# Patient Record
Sex: Female | Born: 1975 | Race: Black or African American | Hispanic: No | Marital: Married | Smoking: Never smoker
Health system: Southern US, Community
[De-identification: ages and names within clinical notes are randomized; demographics above are authoritative.]

## PROBLEM LIST (undated history)

## (undated) DIAGNOSIS — I10 Essential (primary) hypertension: Secondary | ICD-10-CM

## (undated) DIAGNOSIS — E669 Obesity, unspecified: Secondary | ICD-10-CM

## (undated) HISTORY — PX: ABDOMINAL HYSTERECTOMY: SHX81

---

## 2017-06-28 ENCOUNTER — Emergency Department (HOSPITAL_COMMUNITY)
Admission: EM | Admit: 2017-06-28 | Discharge: 2017-06-28 | Disposition: A | Discharge status: Home / Self Care | Payer: Self-pay | Attending: Emergency Medicine | Admitting: Emergency Medicine

## 2017-06-28 ENCOUNTER — Encounter (HOSPITAL_COMMUNITY): Payer: Self-pay | Admitting: Emergency Medicine

## 2017-06-28 ENCOUNTER — Emergency Department (HOSPITAL_COMMUNITY): Payer: Self-pay

## 2017-06-28 DIAGNOSIS — R2241 Localized swelling, mass and lump, right lower limb: Secondary | ICD-10-CM | POA: Insufficient documentation

## 2017-06-28 DIAGNOSIS — R229 Localized swelling, mass and lump, unspecified: Secondary | ICD-10-CM | POA: Insufficient documentation

## 2017-06-28 DIAGNOSIS — I10 Essential (primary) hypertension: Secondary | ICD-10-CM | POA: Insufficient documentation

## 2017-06-28 DIAGNOSIS — IMO0002 Reserved for concepts with insufficient information to code with codable children: Secondary | ICD-10-CM

## 2017-06-28 HISTORY — DX: Essential (primary) hypertension: I10

## 2017-06-28 MED ORDER — METHYLPREDNISOLONE 4 MG PO TBPK: ORAL_TABLET | ORAL | 0 refills | Status: DC

## 2017-06-28 MED ORDER — TRAMADOL-ACETAMINOPHEN 37.5-325 MG PO TABS: ORAL_TABLET | ORAL | 0 refills | Status: DC

## 2017-06-28 NOTE — ED Provider Notes (Signed)
[facility] DEPT Provider Note   CSN: [id number] Arrival date & time: 06/28/17  1713     History   Chief Complaint Chief Complaint  Patient presents with  . Leg Pain    HPI Joan Powers is a 41 y.o. female.  HPI Patient has had a tender nodule in her right upper thigh for about 2 months. Nothing makes it better or worse. He denies her as any trauma or any inciting event that she can identify. She reports that she sleeps on that side and puts pressure on it is painful. No other associated symptoms. No weight loss no fever no chills no nausea no vomiting. No other areas of painful nodules. Past Medical History:  Diagnosis Date  . Hypertension     There are no active problems to display for this patient.   History reviewed. No pertinent surgical history.  OB History    No data available       Home Medications    Prior to Admission medications   Not on File    Family History No family history on file.  Social History Social History  Substance Use Topics  . Smoking status: Never Smoker  . Smokeless tobacco: Never Used  . Alcohol use No     Allergies   Patient has no allergy information on record.   Review of Systems Review of Systems 10 Systems reviewed and are negative for acute change except as noted in the HPI.   Physical Exam Updated Vital Signs BP (!) 167/107 (BP Location: Right Wrist)   Pulse 88   Temp 98.6 F (37 C) (Oral)   Resp 17   Ht 5\' 2"  (1.575 m)   SpO2 100%   Physical Exam  Constitutional: She is oriented to person, place, and time.  Patient is alert and nontoxic. No acute distress.  HENT:  Head: Normocephalic and atraumatic.  Eyes: EOM are normal.  Pulmonary/Chest: Effort normal.  Musculoskeletal:  Patient indicates an area in her mid lateral thigh that is area tenderness. She isolates the area where she finds the nodule. Patient does have  obesity of the legs with cellulite. The skin is normal. There is no erythema,  rash or overlying skin changes. There does appear to be a subtle, round or oval-shaped nodule within the soft tissues of the adipose tissue. This feels to be about 2 cm. It does feel slightly firmer than a lipoma. No surrounding varicose veins. No knee effusion. Lower leg nontender. Skin condition of legs is normal.  Neurological: She is alert and oriented to person, place, and time. No cranial nerve deficit. She exhibits normal muscle tone. Coordination normal.  Skin: Skin is warm and dry.  Psychiatric: She has a normal mood and affect.     ED Treatments / Results  Labs (all labs ordered are listed, but only abnormal results are displayed) Labs Reviewed - No data to display  EKG  EKG Interpretation None       Radiology No results found.  Procedures Procedures (including critical care time)  Medications Ordered in ED Medications - No data to display   Initial Impression / Assessment and Plan / ED Course  I have reviewed the triage vital signs and the nursing notes.  Pertinent labs & imaging results that were available during my care of the patient were reviewed by me and considered in my medical decision making (see chart for details).     Final Clinical Impressions(s) / ED Diagnoses   Final diagnoses:  Lump  Physical exam findings are really very subtle. I cannot appreciate a well-defined mass however it does feel that there is some difference in the tissue consistency in the area the patient points out. She does have a significant amount of adipose tissue and I have fairly high suspicion for a lipoma. Patient however reports that the areas very painful. It has been going on for 2 months. There is no indication there is abscess associated. I feel absolutely no fluctuance and all of the overlying skin tissue is normal. Ultrasound does not identify soft tissue anomaly. At this time, for possible muscular pain, patient will be given a short course of Solu-Medrol and tramadol.  She is however counseled on the importance of follow-up to make sure there is no indolent and serious process such as a sarcoma developing. She understands the follow-up plan is to see either general surgery or a family provider for further diagnostic evaluation if symptoms are persisting after treatment. Her review of systems is otherwise completely negative.  New Prescriptions New Prescriptions   No medications on file     Charlesetta Shanks, MD 06/28/17 2317

## 2017-06-28 NOTE — ED Notes (Signed)
Pt stable, ambulatory, and verbalizes understanding of d/c instructions.  

## 2017-06-28 NOTE — Discharge Instructions (Signed)
1. You have a tender area in the soft tissues of your thigh. Ultrasound did not show an abnormality. At this time, the cause of your symptoms is unknown. Frequently, people develop fat tumors called lipomas that are benign. Sometimes however, a serious tumor that is malignant may develop. It is very important that you schedule a follow-up appointment either with general surgery or a family doctor to reassess the nodule after treatment. You may need further imaging such as an MRI to make sure there is no tumor deeper in the muscle tissue.

## 2017-06-28 NOTE — ED Notes (Signed)
Patient transported to [facility] 

## 2017-06-28 NOTE — ED Triage Notes (Signed)
Pt presents with worsening leg pain to upper right thigh. Reports a knot formed 2 months ago and pain as gotten worse. Able to tolerate weight on leg but painful to walk.

## 2017-11-29 ENCOUNTER — Encounter (HOSPITAL_COMMUNITY): Payer: Self-pay | Admitting: *Deleted

## 2017-11-29 ENCOUNTER — Other Ambulatory Visit: Payer: Self-pay

## 2017-11-29 ENCOUNTER — Emergency Department (HOSPITAL_COMMUNITY)
Admission: EM | Admit: 2017-11-29 | Discharge: 2017-11-29 | Disposition: A | Discharge status: Home / Self Care | Payer: BLUE CROSS/BLUE SHIELD | Attending: Emergency Medicine | Admitting: Emergency Medicine

## 2017-11-29 DIAGNOSIS — M545 Low back pain: Secondary | ICD-10-CM | POA: Diagnosis present

## 2017-11-29 DIAGNOSIS — M7918 Myalgia, other site: Secondary | ICD-10-CM | POA: Diagnosis not present

## 2017-11-29 DIAGNOSIS — Y939 Activity, unspecified: Secondary | ICD-10-CM | POA: Diagnosis not present

## 2017-11-29 DIAGNOSIS — Y999 Unspecified external cause status: Secondary | ICD-10-CM | POA: Diagnosis not present

## 2017-11-29 DIAGNOSIS — Y929 Unspecified place or not applicable: Secondary | ICD-10-CM | POA: Insufficient documentation

## 2017-11-29 HISTORY — DX: Obesity, unspecified: E66.9

## 2017-11-29 MED ORDER — CYCLOBENZAPRINE HCL 10 MG PO TABS: 10.0000 mg | ORAL_TABLET | Freq: Two times a day (BID) | ORAL | 0 refills | Status: DC | PRN
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
## 2017-11-29 NOTE — ED Notes (Signed)
Declined W/C at D/C and was escorted to lobby by RN. 
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
## 2017-11-29 NOTE — Discharge Instructions (Signed)
Please read attached information. If you experience any new or worsening signs or symptoms please return to the emergency room for evaluation. Please follow-up with your primary care provider or specialist as discussed. Please use medication prescribed only as directed and discontinue taking if you have any concerning signs or symptoms.   °

## 2018-04-09 ENCOUNTER — Encounter (HOSPITAL_COMMUNITY): Payer: Self-pay | Admitting: Emergency Medicine

## 2018-04-09 ENCOUNTER — Emergency Department (HOSPITAL_COMMUNITY): Payer: BLUE CROSS/BLUE SHIELD

## 2018-04-09 ENCOUNTER — Emergency Department (HOSPITAL_COMMUNITY)
Admission: EM | Admit: 2018-04-09 | Discharge: 2018-04-09 | Disposition: A | Discharge status: Home / Self Care | Payer: BLUE CROSS/BLUE SHIELD | Attending: Emergency Medicine | Admitting: Emergency Medicine

## 2018-04-09 ENCOUNTER — Other Ambulatory Visit: Payer: Self-pay

## 2018-04-09 DIAGNOSIS — R05 Cough: Secondary | ICD-10-CM | POA: Diagnosis present

## 2018-04-09 DIAGNOSIS — H6691 Otitis media, unspecified, right ear: Secondary | ICD-10-CM | POA: Insufficient documentation

## 2018-04-09 DIAGNOSIS — H9311 Tinnitus, right ear: Secondary | ICD-10-CM | POA: Insufficient documentation

## 2018-04-09 DIAGNOSIS — J069 Acute upper respiratory infection, unspecified: Secondary | ICD-10-CM | POA: Diagnosis not present

## 2018-04-09 DIAGNOSIS — I10 Essential (primary) hypertension: Secondary | ICD-10-CM | POA: Insufficient documentation

## 2018-04-09 DIAGNOSIS — B9789 Other viral agents as the cause of diseases classified elsewhere: Secondary | ICD-10-CM

## 2018-04-09 MED ORDER — AMOXICILLIN-POT CLAVULANATE 875-125 MG PO TABS: 1.0000 | ORAL_TABLET | Freq: Two times a day (BID) | ORAL | 0 refills | Status: DC

## 2018-04-09 MED ORDER — BENZONATATE 100 MG PO CAPS: 100.0000 mg | ORAL_CAPSULE | Freq: Three times a day (TID) | ORAL | 0 refills | Status: DC

## 2018-04-09 MED ORDER — AMLODIPINE BESYLATE 10 MG PO TABS: 10.0000 mg | ORAL_TABLET | Freq: Every day | ORAL | 0 refills | Status: AC

## 2018-04-09 MED ORDER — LISINOPRIL 10 MG PO TABS: 10.0000 mg | ORAL_TABLET | Freq: Every day | ORAL | 0 refills | Status: AC

## 2018-04-09 NOTE — ED Notes (Signed)
Patient verbalizes understanding of discharge instructions. Opportunity for questioning and answers were provided. Armband removed by staff, pt discharged from ED ambulatory.   

## 2018-04-09 NOTE — Discharge Instructions (Addendum)
Right ear is likely infected, please take course of Augmentin as directed. You may also use Flonase and Zyrtec to help with congestion. Your chest x-ray looks good, use tessalon perles as needed for cough.  Your blood pressure was elevated today, please start taking you BP meds again, please follow up with your primary doctor in 1 week for blood pressure recheck.

## 2018-04-09 NOTE — ED Provider Notes (Signed)
Patient placed in Quick Look pathway, seen and evaluated   Chief Complaint: cough and right ear congestion  HPI:   Pt reports ringing in right ear,  Pt complains of coughing and being short of breath  ROS: no fever. No chest pain  Physical Exam:   Gen: No distress  Neuro: Awake and Alert  Skin: Warm    Focused Exam: Right tm dull,     Initiation of care has begun. The patient has been counseled on the process, plan, and necessity for staying for the completion/evaluation, and the remainder of the medical screening examination   Joan Powers 04/09/18 [place], [place], MD 04/11/18 [phone]

## 2018-04-09 NOTE — ED Notes (Signed)
ED Provider at bedside. 

## 2018-04-09 NOTE — ED Triage Notes (Signed)
Pt reports a productive cough with green mucous and nasal congestion that started two weeks ago. Pt reports ringing in her ears as well that has been going on for a month, pt has been attempting to relieve it with ear flushes without relief. Pt has taken mucinex and cough medicine with minimal relief. Hx of HTN but pt ran out of her medications.

## 2018-04-09 NOTE — ED Notes (Signed)
Pt states she ran out of blood pressure medicine 2 months ago.

## 2018-04-09 NOTE — ED Provider Notes (Signed)
[facility] EMERGENCY DEPARTMENT Provider Note   CSN: [id number] Arrival date & time: 04/09/18  1532     History   Chief Complaint Chief Complaint  Patient presents with  . Tinnitus  . Nasal Congestion  . Cough    HPI Joan Powers is a 42 y.o. female.  Joan Powers is a 42 y.o. Female with history of hypertension, and obesity, who presents to the ED for evaluation of tinnitus, cough and nasal congestion. Pt reports ear has been intermittently ringing for the past month, but for the past two weeks she has had productive cough of green mucous and has had nasal congestion. She has been taking mucinex and other over the counter cough syrups without improvement, has not tried anything else to treat her symptoms. She denies chest pain or shortness of breath, no fevers or chills. Pt noted to be hypertensive, reports she ran out of her BP meds 2 weeks ago, she usually take lisinopril and amlodipine daily, she recently moved here and has not found a new PCP yet.     Past Medical History:  Diagnosis Date  . Hypertension   . Obesity     There are no active problems to display for this patient.   History reviewed. No pertinent surgical history.   OB History   None      Home Medications    Prior to Admission medications   Medication Sig Start Date End Date Taking? Authorizing Provider  guaiFENesin (MUCINEX) 600 MG 12 hr tablet Take 1,200 mg by mouth 2 (two) times daily as needed for cough or to loosen phlegm.   Yes [provider]  amLODipine (NORVASC) 10 MG tablet Take 1 tablet (10 mg total) by mouth daily. 04/09/18   Jacqlyn Larsen, PA-C  amoxicillin-clavulanate (AUGMENTIN) 875-125 MG tablet Take 1 tablet by mouth 2 (two) times daily. One po bid x 7 days 04/09/18   Jacqlyn Larsen, PA-C  benzonatate (TESSALON) 100 MG capsule Take 1 capsule (100 mg total) by mouth every 8 (eight) hours. 04/09/18   Jacqlyn Larsen, PA-C  lisinopril (PRINIVIL,ZESTRIL)  10 MG tablet Take 1 tablet (10 mg total) by mouth daily. 04/09/18   Jacqlyn Larsen, PA-C    Family History No family history on file.  Social History Social History   Tobacco Use  . Smoking status: Never Smoker  . Smokeless tobacco: Never Used  Substance Use Topics  . Alcohol use: No  . Drug use: No     Allergies   Patient has no known allergies.   Review of Systems Review of Systems  Constitutional: Negative for chills and fever.  HENT: Positive for congestion, ear pain and tinnitus. Negative for ear discharge, postnasal drip, rhinorrhea, sinus pressure and sore throat.   Respiratory: Positive for cough. Negative for shortness of breath.   Cardiovascular: Negative for chest pain.  Gastrointestinal: Negative for abdominal pain, nausea and vomiting.  Musculoskeletal: Negative for neck pain and neck stiffness.  Skin: Negative for color change and rash.  Neurological: Negative for headaches.     Physical Exam Updated Vital Signs BP (!) 185/108 (BP Location: Right Wrist)   Pulse (!) 102   Temp 98.6 F (37 C) (Oral)   Resp 18   Ht 5\' 1"  (1.549 m)   Wt 113.4 kg (250 lb)   LMP 04/03/2018   SpO2 100%   BMI 47.24 kg/m   Physical Exam  Constitutional: She appears well-developed and well-nourished. No distress.  HENT:  Head: Normocephalic and atraumatic.  Right TM dull w/o good landmarks, effusion present, Right TM clear with good landmarks, moderate nasal mucosa edema with clear rhinorrhea, posterior oropharynx clear and moist, with some erythema, no edema or exudates  Eyes: Right eye exhibits no discharge. Left eye exhibits no discharge.  Neck: Neck supple.  Cardiovascular: Normal rate, regular rhythm, normal heart sounds and intact distal pulses.  Pulmonary/Chest: Effort normal and breath sounds normal. No stridor. No respiratory distress. She has no wheezes. She has no rales.  Respirations equal and unlabored, patient able to speak in full sentences, lungs clear to  auscultation bilaterally  Abdominal: Soft. Bowel sounds are normal. She exhibits no distension. There is no tenderness.  Neurological: She is alert. Coordination normal.  Skin: Skin is warm and dry. Capillary refill takes less than 2 seconds. She is not diaphoretic.  Psychiatric: She has a normal mood and affect. Her behavior is normal.  Nursing note and vitals reviewed.    ED Treatments / Results  Labs (all labs ordered are listed, but only abnormal results are displayed) Labs Reviewed - No data to display  EKG None  Radiology Dg Chest 2 View  Result Date: 04/09/2018 CLINICAL DATA:  Cough and congestion EXAM: CHEST - 2 VIEW COMPARISON:  None. FINDINGS: The heart size and mediastinal contours are within normal limits. Both lungs are clear. The visualized skeletal structures are unremarkable. IMPRESSION: No active cardiopulmonary disease. Electronically Signed   By: Inez Catalina M.D.   On: 04/09/2018 16:29    Procedures Procedures (including critical care time)  Medications Ordered in ED Medications - No data to display   Initial Impression / Assessment and Plan / ED Course  I have reviewed the triage vital signs and the nursing notes.  Pertinent labs & imaging results that were available during my care of the patient were reviewed by me and considered in my medical decision making (see chart for details).  Patient presents with otalgia and tinnitus, and exam consistent with acute otitis media. No concern for acute mastoiditis, meningitis.  Will treat with Augmentin and decongestants, pt to follow up with ENT.  Pt presents with nasal congestion and cough as well. Pt is well appearing and vitals are normal. Lungs CTA on exam. Pt CXR negative for acute infiltrate. Patients symptoms are consistent with URI, likely viral etiology. Discussed that antibiotics are not indicated for viral infections. Pt will be discharged with symptomatic treatment.  Verbalizes understanding and is  agreeable with plan. Pt is hemodynamically stable & in NAD prior to dc.   Pt's blood pressure was elevated today, pt has hx of HTN, not taking their medications, pt is not exhibiting any symptoms to suggest hypertensive urgency or emergency today, short term refill for medications provided, will have pt follow up with their PCP in 1 week for blood pressure check. Discussed long term consequences of untreated hypertension with the patient.   Final Clinical Impressions(s) / ED Diagnoses   Final diagnoses:  Right otitis media, unspecified otitis media type  Tinnitus of right ear  Viral URI with cough    ED Discharge Orders        Ordered    lisinopril (PRINIVIL,ZESTRIL) 10 MG tablet  Daily     04/09/18 1723    amLODipine (NORVASC) 10 MG tablet  Daily     04/09/18 1723    amoxicillin-clavulanate (AUGMENTIN) 875-125 MG tablet  2 times daily     04/09/18 1723    benzonatate (TESSALON) 100 MG capsule  Every 8 hours     04/09/18 1723       Jacqlyn Larsen, PA-C 04/09/18 1742    Valarie Merino, MD 04/09/18 [phone]

## 2018-05-23 ENCOUNTER — Ambulatory Visit: Payer: Self-pay | Admitting: Family Medicine

## 2018-11-26 IMAGING — US US EXTREM LOW*R* LIMITED
1 series · 14 of 16 positions shown · non-contrast
Comparison: None.

CLINICAL DATA: RIGHT mid thigh nodule and tenderness for 2 months

EXAM:
ULTRASOUND RIGHT LOWER EXTREMITY LIMITED
TECHNIQUE: Ultrasound examination of the lower extremity soft tissues was
performed in the area of clinical concern.

[Series 1: us extrem low*right* limited · 0.06mm/px · 16 acquisitions, 14 frames shown]
[im 1/16]
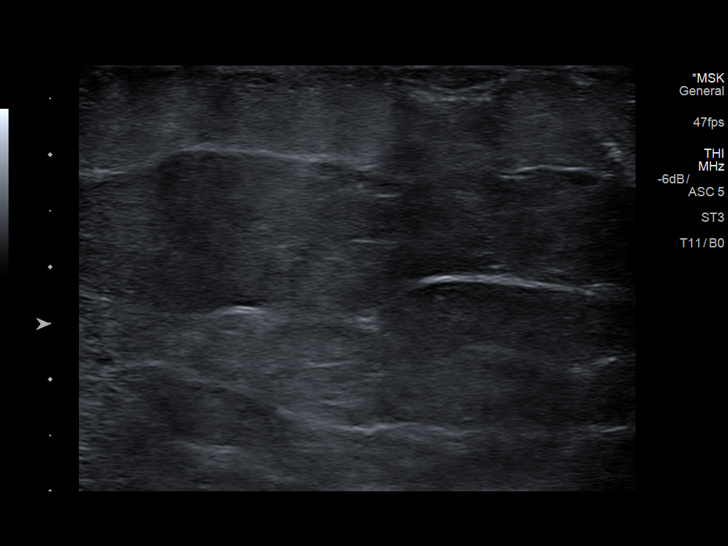
[im 2/16]
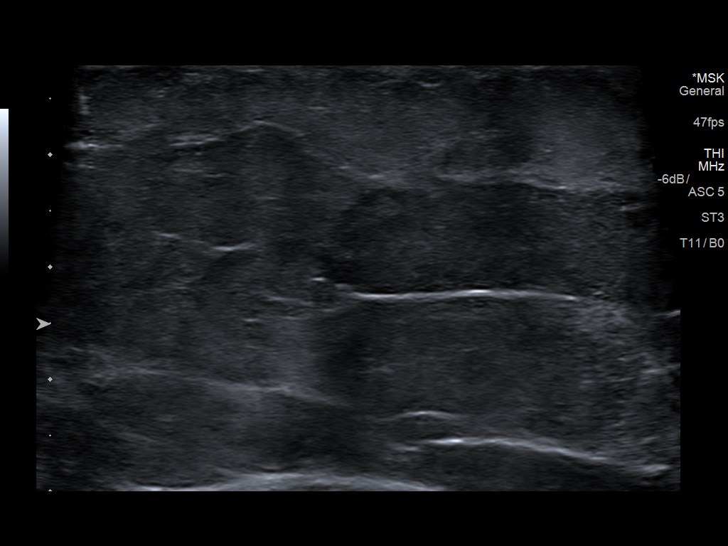
[im 3/16]
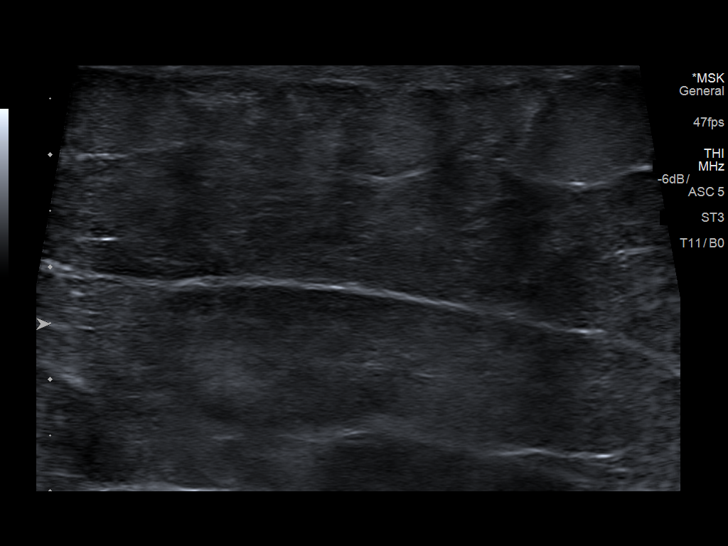
[im 5/16]
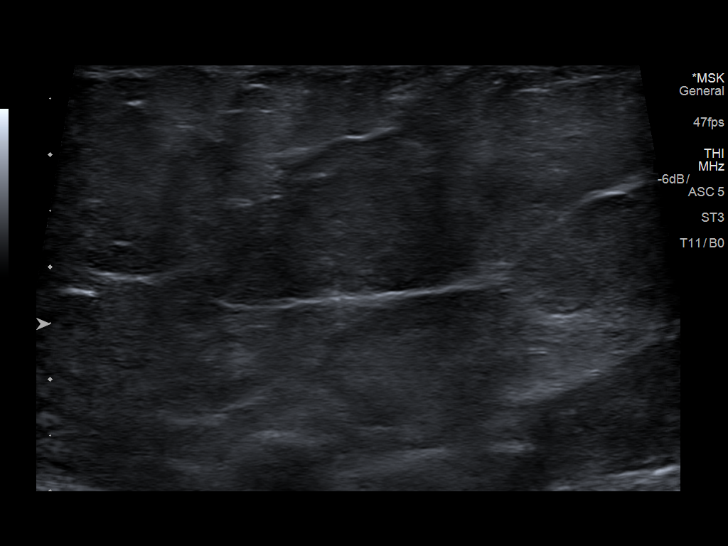
[im 6/16]
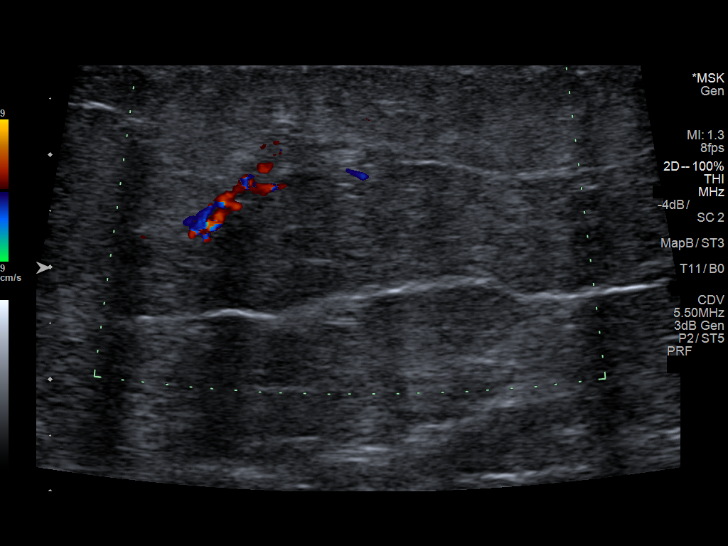
[im 7/16]
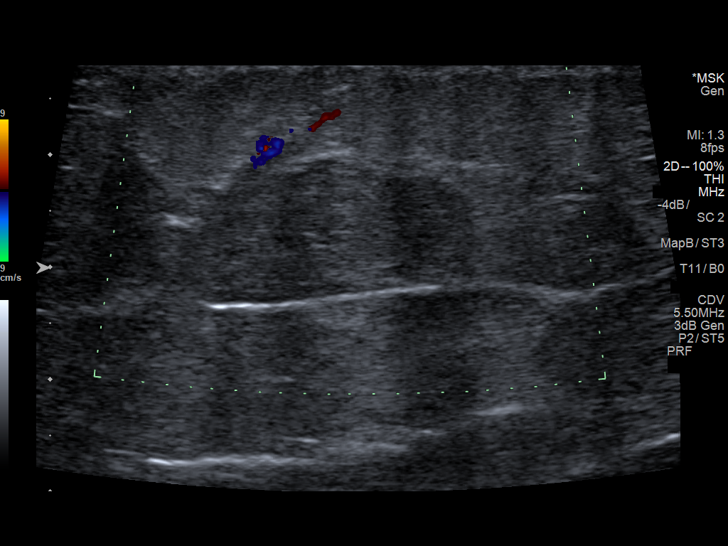
[im 8/16]
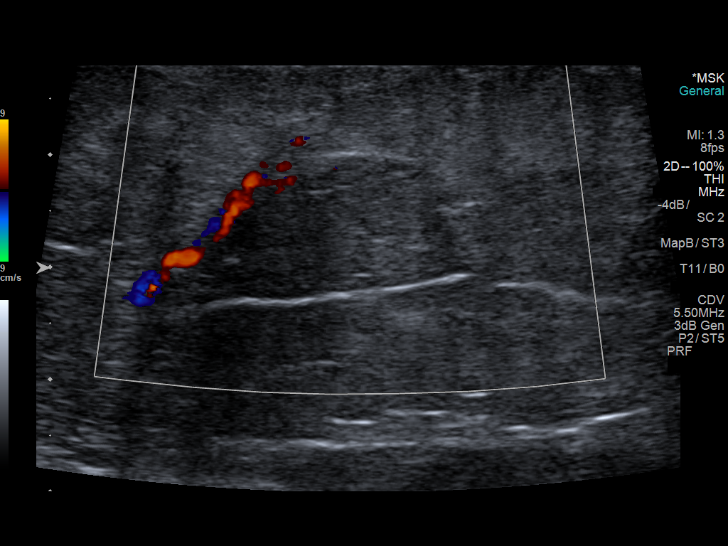
[im 9/16]
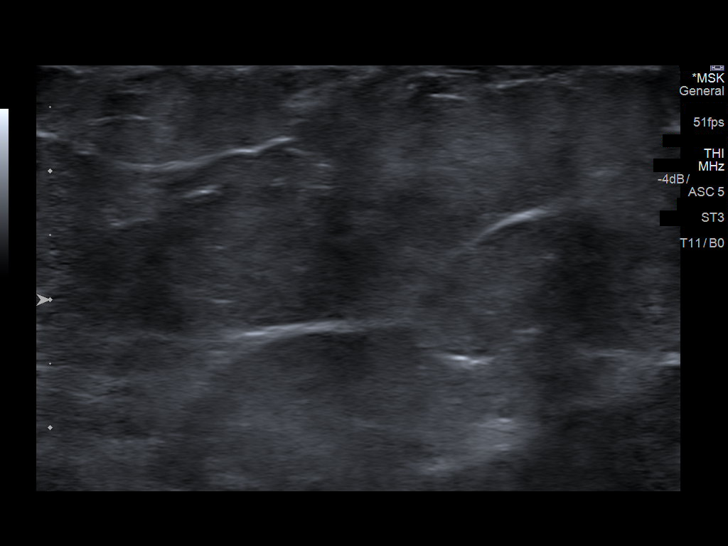
[im 10/16]
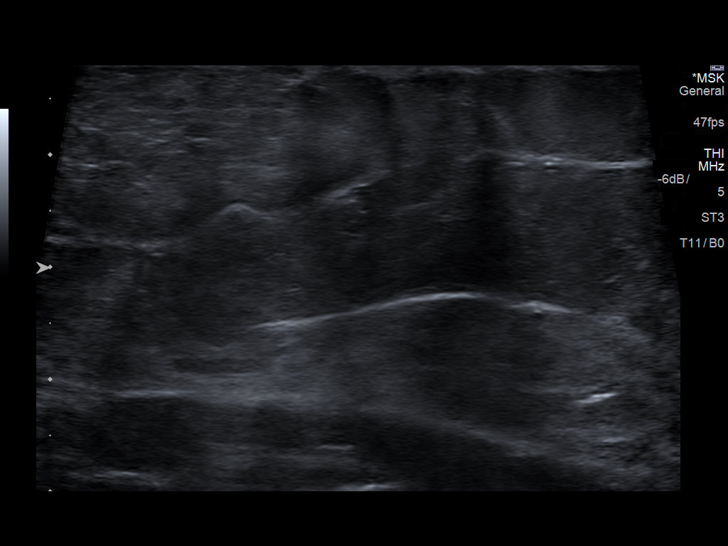
[im 11/16]
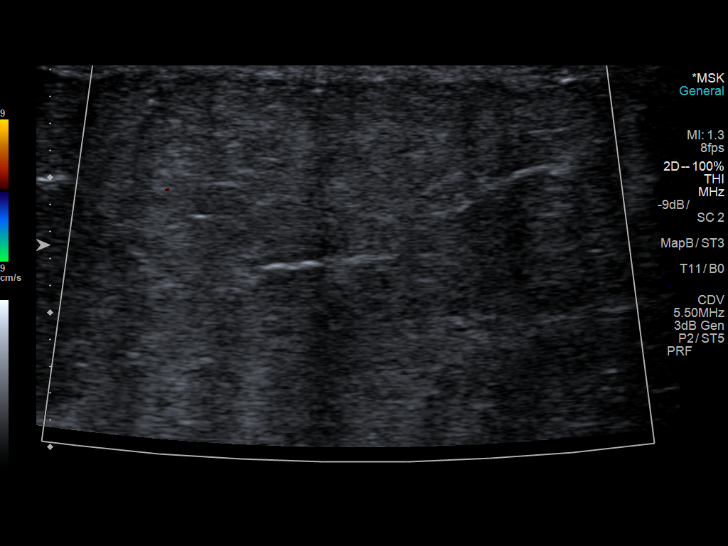
[im 13/16]
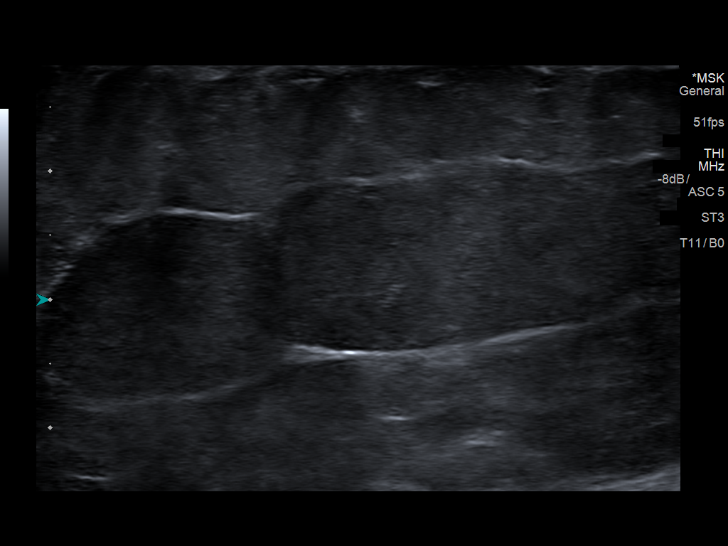
[im 14/16]
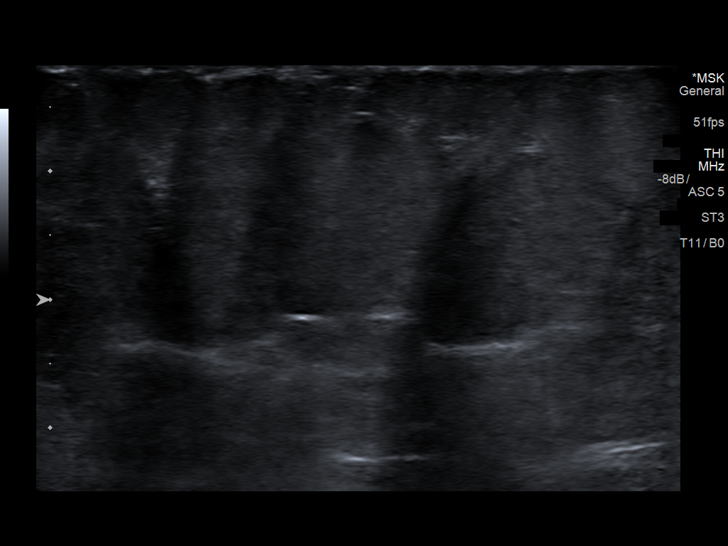
[im 15/16]
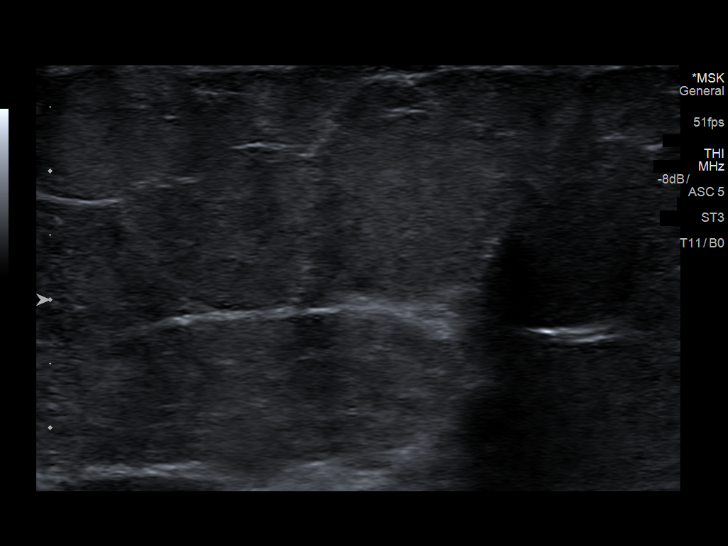
[im 16/16]
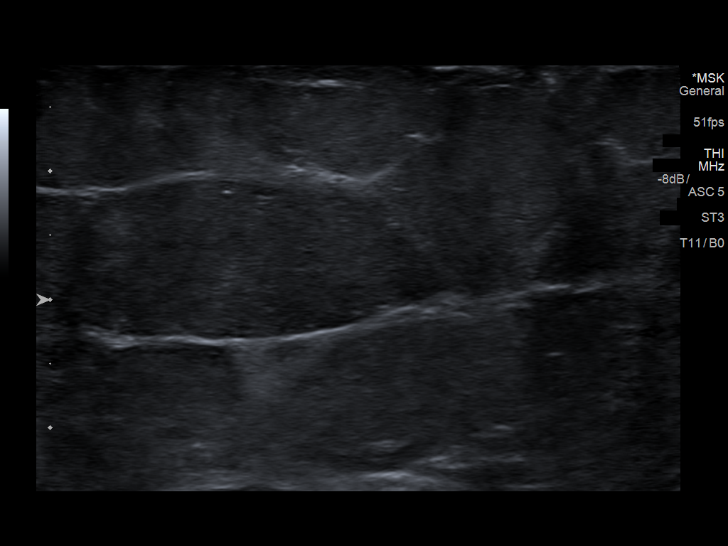

[14 of 16 positions shown; findings below may reference images not displayed]

FINDINGS: Sonography of the site of clinical concern at the lateral RIGHT
thigh was performed.

At this site, unremarkable lobules of subcutaneous fat are
identified.

No discrete soft tissue mass, cystic collection, or calcification is
seen.

No hyperemia on color Doppler imaging.
IMPRESSION: Negative ultrasound of the site of clinical concern at the RIGHT
thigh.

If patient has persistent symptoms consider MR follow-up.

## 2019-11-19 IMAGING — DX DG CHEST 2V
2 series · 2 of 2 positions shown · non-contrast
Comparison: None.

CLINICAL DATA: Cough and congestion

EXAM:
CHEST - 2 VIEW

[w chest pa]
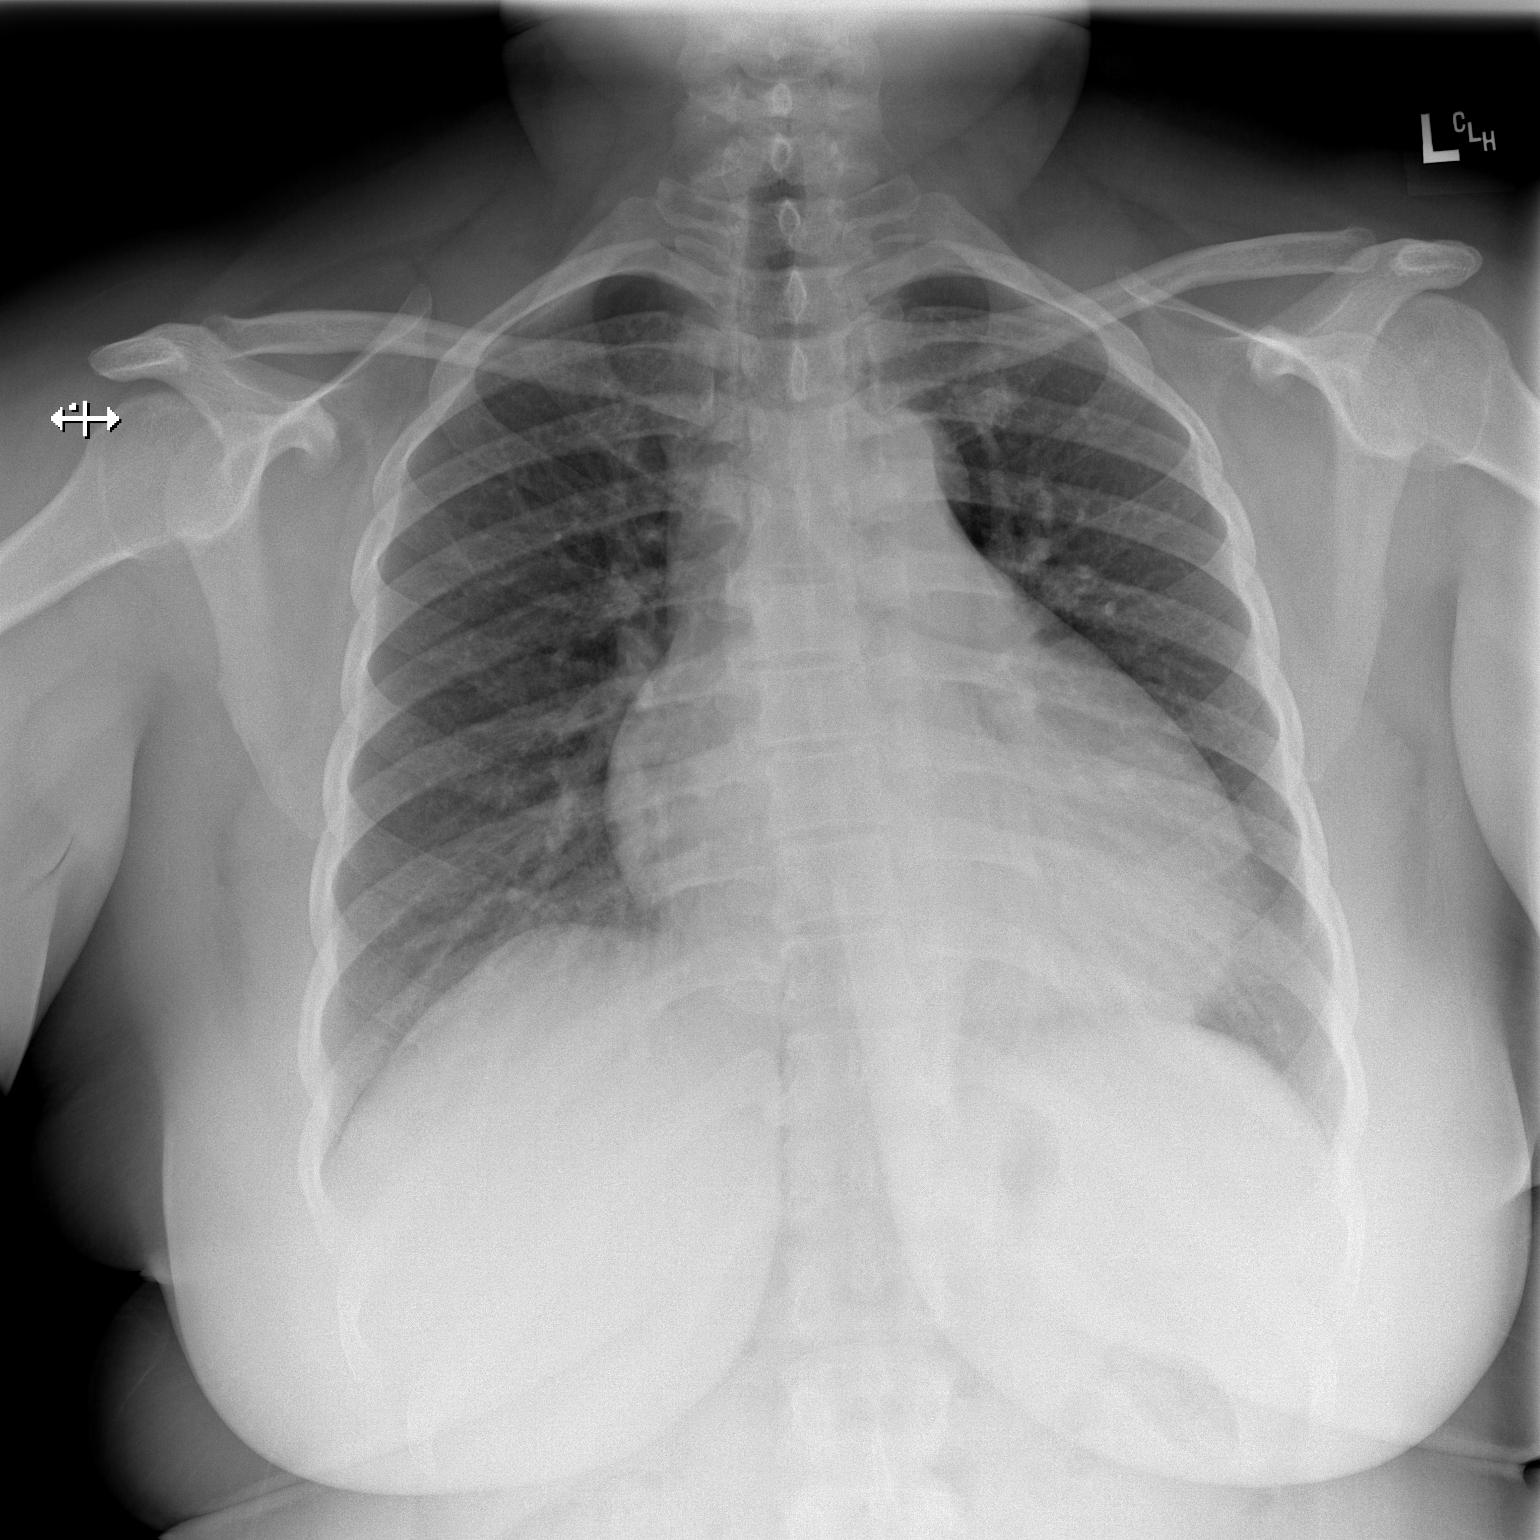

[w chest lat]
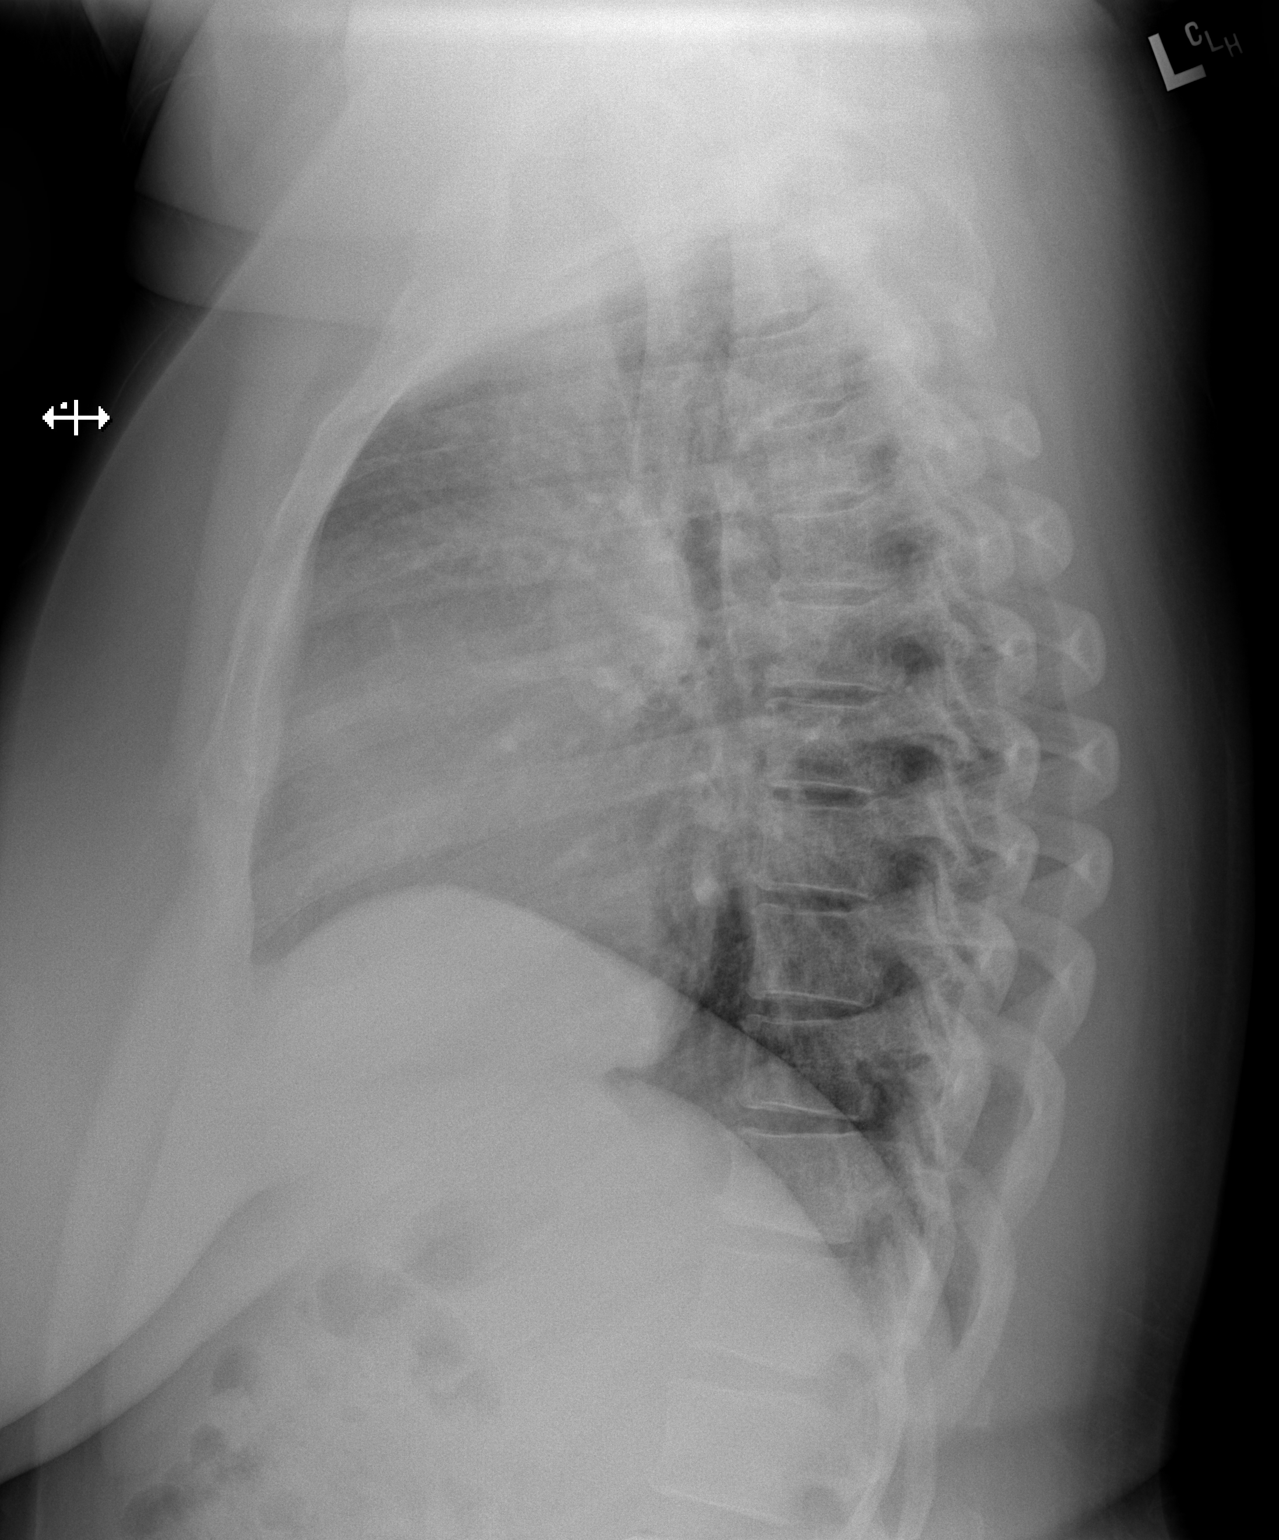

[2 of 2 positions shown; findings below may reference images not displayed]

FINDINGS: The heart size and mediastinal contours are within normal limits.
Both lungs are clear. The visualized skeletal structures are
unremarkable.
IMPRESSION: No active cardiopulmonary disease.

## 2020-01-16 ENCOUNTER — Other Ambulatory Visit: Payer: Self-pay

## 2020-01-16 ENCOUNTER — Encounter (HOSPITAL_COMMUNITY): Payer: Self-pay | Admitting: Pediatrics

## 2020-01-16 ENCOUNTER — Emergency Department (HOSPITAL_COMMUNITY)
Admission: EM | Admit: 2020-01-16 | Discharge: 2020-01-16 | Disposition: A | Discharge status: Home / Self Care | Payer: BLUE CROSS/BLUE SHIELD | Attending: Emergency Medicine | Admitting: Emergency Medicine

## 2020-01-16 DIAGNOSIS — E669 Obesity, unspecified: Secondary | ICD-10-CM | POA: Diagnosis not present

## 2020-01-16 DIAGNOSIS — I10 Essential (primary) hypertension: Secondary | ICD-10-CM | POA: Diagnosis not present

## 2020-01-16 DIAGNOSIS — Z6841 Body Mass Index (BMI) 40.0 and over, adult: Secondary | ICD-10-CM | POA: Insufficient documentation

## 2020-01-16 DIAGNOSIS — D259 Leiomyoma of uterus, unspecified: Secondary | ICD-10-CM | POA: Insufficient documentation

## 2020-01-16 DIAGNOSIS — D219 Benign neoplasm of connective and other soft tissue, unspecified: Secondary | ICD-10-CM

## 2020-01-16 DIAGNOSIS — R1033 Periumbilical pain: Secondary | ICD-10-CM | POA: Diagnosis present

## 2020-01-16 DIAGNOSIS — Z79899 Other long term (current) drug therapy: Secondary | ICD-10-CM | POA: Diagnosis not present

## 2020-01-16 DIAGNOSIS — R1030 Lower abdominal pain, unspecified: Secondary | ICD-10-CM

## 2020-01-16 LAB — COMPREHENSIVE METABOLIC PANEL
ALT: 16 U/L (ref 0–44)
AST: 20 U/L (ref 15–41)
Albumin: 3.7 g/dL (ref 3.5–5.0)
Alkaline Phosphatase: 63 U/L (ref 38–126)
Anion gap: 10 (ref 5–15)
BUN: 11 mg/dL (ref 6–20)
CO2: 24 mmol/L (ref 22–32)
Calcium: 9.1 mg/dL (ref 8.9–10.3)
Chloride: 105 mmol/L (ref 98–111)
Creatinine, Ser: 0.79 mg/dL (ref 0.44–1.00)
GFR calc Af Amer: >60 mL/min (ref >60)
GFR calc non Af Amer: >60 mL/min (ref >60)
Glucose, Bld: 117 mg/dL — ABNORMAL HIGH (ref 70–99)
Potassium: 3.7 mmol/L (ref 3.5–5.1)
Sodium: 139 mmol/L (ref 135–145)
Total Bilirubin: 0.4 mg/dL (ref 0.3–1.2)
Total Protein: 7.8 g/dL (ref 6.5–8.1)

## 2020-01-16 LAB — I-STAT BETA HCG BLOOD, ED (MC, WL, AP ONLY): I-stat hCG, quantitative: <5 m[IU]/mL (ref <5)

## 2020-01-16 LAB — TYPE AND SCREEN
ABO/RH(D): B POS
Antibody Screen: NEGATIVE

## 2020-01-16 LAB — CBC
HCT: 33.7 % — ABNORMAL LOW (ref 36.0–46.0)
Hemoglobin: 9.1 g/dL — ABNORMAL LOW (ref 12.0–15.0)
MCH: 19 pg — ABNORMAL LOW (ref 26.0–34.0)
MCHC: 27 g/dL — ABNORMAL LOW (ref 30.0–36.0)
MCV: 70.2 fL — ABNORMAL LOW (ref 80.0–100.0)
Platelets: 285 10*3/uL (ref 150–400)
RBC: 4.8 MIL/uL (ref 3.87–5.11)
RDW: 24.1 % — ABNORMAL HIGH (ref 11.5–15.5)
WBC: 6.9 10*3/uL (ref 4.0–10.5)
nRBC: 0 % (ref 0.0–0.2)

## 2020-01-16 LAB — ABO/RH: ABO/RH(D): B POS

## 2020-01-16 NOTE — ED Notes (Signed)
Pt was discharged from ED in NAD, verbalized understanding of discharge instructions

## 2020-01-16 NOTE — ED Provider Notes (Signed)
[place] [facility] Provider Note   CSN: [id number] Arrival date & time: 01/16/20  1436   History Chief Complaint  Patient presents with  . Abdominal Pain    Joan Powers is a 44 y.o. female with history of hypertension, iron deficiency anemia, fibroids who presents with abdominal pain.  She states she has been having lower abdominal pain intermittently since February.  Pain is been worse in the past 4 days.  It is in the suprapubic area and she has trouble getting comfortable especially at nighttime.  She went to urgent care on Sunday because she was having hematochezia.  Rectal exam was performed as well as a Hemoccult which were unremarkable.  Her hemoglobin was checked which was around 9 and stable.  She went back to family medicine the next day and was rechecked for the same problem.  She states that bleeding has resolved however she still having lower abdominal pain.  She believes it is related to her fibroids and has an appointment with OB/GYN on May 6 but due to her discomfort she decided to come to the ED.  She is requesting that the fibroids are removed today.  She is not having any vaginal bleeding, discharge, urinary symptoms.  No fever, nausea or vomiting.  Last menstrual period was April 10 and she states that she will pass clots when she bleeds.  She denies any lightheadedness or syncope.    HPI     Past Medical History:  Diagnosis Date  . Hypertension   . Obesity     There are no problems to display for this patient.   History reviewed. No pertinent surgical history.   OB History   No obstetric history on file.     No family history on file.  Social History   Tobacco Use  . Smoking status: Never Smoker  . Smokeless tobacco: Never Used  Substance Use Topics  . Alcohol use: No  . Drug use: No    Home Medications Prior to Admission medications   Medication Sig Start Date End Date Taking? Authorizing Provider    amLODipine (NORVASC) 10 MG tablet Take 1 tablet (10 mg total) by mouth daily. 04/09/18   Jacqlyn Larsen, PA-C  amoxicillin-clavulanate (AUGMENTIN) 875-125 MG tablet Take 1 tablet by mouth 2 (two) times daily. One po bid x 7 days 04/09/18   Jacqlyn Larsen, PA-C  benzonatate (TESSALON) 100 MG capsule Take 1 capsule (100 mg total) by mouth every 8 (eight) hours. 04/09/18   Jacqlyn Larsen, PA-C  guaiFENesin (MUCINEX) 600 MG 12 hr tablet Take 1,200 mg by mouth 2 (two) times daily as needed for cough or to loosen phlegm.    [provider]  lisinopril (PRINIVIL,ZESTRIL) 10 MG tablet Take 1 tablet (10 mg total) by mouth daily. 04/09/18   Jacqlyn Larsen, PA-C    Allergies    Patient has no known allergies.  Review of Systems   Review of Systems  Constitutional: Negative for chills and fever.  Respiratory: Negative for shortness of breath.   Cardiovascular: Negative for chest pain.  Gastrointestinal: Positive for abdominal pain. Negative for blood in stool, constipation, diarrhea, nausea and vomiting.  Genitourinary: Negative for dysuria, flank pain, vaginal bleeding and vaginal discharge.  Neurological: Negative for syncope and light-headedness.  All other systems reviewed and are negative.   Physical Exam Updated Vital Signs BP (!) 158/113 (BP Location: Left Arm)   Pulse 90   Temp 98.9 F (37.2 C) (Oral)  Resp 17   Ht 5\' 1"  (1.549 m)   Wt 113.4 kg   SpO2 100%   BMI 47.24 kg/m   Physical Exam Vitals and nursing note reviewed.  Constitutional:      General: She is not in acute distress.    Appearance: She is well-developed. She is obese. She is not ill-appearing.  HENT:     Head: Normocephalic and atraumatic.  Eyes:     General: No scleral icterus.       Right eye: No discharge.        Left eye: No discharge.     Conjunctiva/sclera: Conjunctivae normal.     Pupils: Pupils are equal, round, and reactive to light.  Cardiovascular:     Rate and Rhythm: Normal rate and  regular rhythm.  Pulmonary:     Effort: Pulmonary effort is normal. No respiratory distress.     Breath sounds: Normal breath sounds.  Abdominal:     General: Abdomen is protuberant. There is no distension.     Palpations: Abdomen is soft.     Tenderness: There is abdominal tenderness (suprapubic tenderness).  Musculoskeletal:     Cervical back: Normal range of motion.  Skin:    General: Skin is warm and dry.  Neurological:     Mental Status: She is alert and oriented to person, place, and time.  Psychiatric:        Behavior: Behavior normal.     ED Results / Procedures / Treatments   Labs (all labs ordered are listed, but only abnormal results are displayed) Labs Reviewed  COMPREHENSIVE METABOLIC PANEL - Abnormal; Notable for the following components:      Result Value   Glucose, Bld 117 (*)    All other components within normal limits  CBC - Abnormal; Notable for the following components:   Hemoglobin 9.1 (*)    HCT 33.7 (*)    MCV 70.2 (*)    MCH 19.0 (*)    MCHC 27.0 (*)    RDW 24.1 (*)    All other components within normal limits  I-STAT BETA HCG BLOOD, ED ([facility], [facility], [facility] ONLY)  POC OCCULT BLOOD, ED  TYPE AND SCREEN  ABO/RH    EKG None  Radiology No results found.  Procedures Procedures (including critical care time)  Medications Ordered in ED Medications - No data to display  ED Course  I have reviewed the triage vital signs and the nursing notes.  Pertinent labs & imaging results that were available during my care of the patient were reviewed by me and considered in my medical decision making (see chart for details).  44 year old female presents with acute on chronic lower abdominal pain which she attributes to fibroids.  She is mildly hypertensive but otherwise vital signs are normal.  Abdomen is soft and minimally tender in the suprapubic area.  Per chart review she has multiple fibroids.  She is essentially here requesting to have them removed today.   Advised patient is does not an emergent procedure and she will need to follow-up with OB/GYN.  Labs were obtained in triage and shows stable hemoglobin.  Patient is offered pelvic exam and repeat imaging however she is declining.  She is offered pain control and declined this as well.  Advise follow-up with OB/GYN  MDM Rules/Calculators/A&P                       Final Clinical Impression(s) / ED Diagnoses Final diagnoses:  Lower  abdominal pain  Fibroids    Rx / DC Orders ED Discharge Orders    None       Recardo Evangelist, PA-C [id number]    Delora Fuel, MD [id number]

## 2020-01-16 NOTE — Discharge Instructions (Signed)
Try a heating pad on the area to provide some pain relief You can take Ibuprofen or Tylenol for pelvic pain Please follow up with OBGYN

## 2020-01-16 NOTE — ED Triage Notes (Signed)
Patient stated hx of uterine fibroids. C/O lower abdominal pain and some bleeding.

## 2023-10-09 ENCOUNTER — Encounter (HOSPITAL_COMMUNITY): Payer: Self-pay | Admitting: Emergency Medicine

## 2023-10-09 ENCOUNTER — Ambulatory Visit (HOSPITAL_COMMUNITY)
Admission: EM | Admit: 2023-10-09 | Discharge: 2023-10-09 | Disposition: A | Discharge status: Home / Self Care | Payer: No Typology Code available for payment source | Attending: Family Medicine | Admitting: Family Medicine

## 2023-10-09 ENCOUNTER — Other Ambulatory Visit: Payer: Self-pay

## 2023-10-09 DIAGNOSIS — L309 Dermatitis, unspecified: Secondary | ICD-10-CM

## 2023-10-09 MED ORDER — TRIAMCINOLONE ACETONIDE 0.5 % EX OINT
1.0000 | TOPICAL_OINTMENT | Freq: Three times a day (TID) | CUTANEOUS | 0 refills | Status: AC | PRN
Start: 2023-10-09 — End: ?

## 2023-10-09 MED ORDER — PREDNISONE 20 MG PO TABS: 20.0000 mg | ORAL_TABLET | Freq: Every day | ORAL | 0 refills | Status: AC

## 2023-10-09 MED ORDER — FAMOTIDINE 20 MG PO TABS: 20.0000 mg | ORAL_TABLET | Freq: Two times a day (BID) | ORAL | 0 refills | Status: AC

## 2023-10-09 NOTE — ED Provider Notes (Signed)
[facility]    CSN: [id number] Arrival date & time: 10/09/23  1040      History   Chief Complaint Chief Complaint  Patient presents with   Rash    HPI Joan Powers is a 48 y.o. female.    Rash 3 days of rash.  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  She report a few hours after eating the sushi rolls she developed a mild itch around her chest wall and neck and upon awaking the next day she had a rash that extended to her face and covered her neck.  Patient reports no known food or drug allergies.  She has been taking Benadryl and applying cortisone cream.  Cortisone cream burned rash and Benadryl has only mildly improved itching.  Denies any difficulty breathing, lip swelling or difficulty swallowing.  Past Medical History:  Diagnosis Date   Hypertension    Obesity     There are no active problems to display for this patient.   Past Surgical History:  Procedure Laterality Date   ABDOMINAL HYSTERECTOMY      OB History   No obstetric history on file.      Home Medications    Prior to Admission medications   Medication Sig Start Date End Date Taking? Authorizing Provider  famotidine (PEPCID) 20 MG tablet Take 1 tablet (20 mg total) by mouth 2 (two) times daily for 7 days. 10/09/23 10/16/23 Yes Bing Neighbors, NP  predniSONE (DELTASONE) 20 MG tablet Take 1 tablet (20 mg total) by mouth daily with breakfast for 5 days. 10/09/23 10/14/23 Yes Bing Neighbors, NP  triamcinolone ointment (KENALOG) 0.5 % Apply 1 Application topically 3 (three) times daily as needed (rash). 10/09/23  Yes Bing Neighbors, NP  amLODipine (NORVASC) 10 MG tablet Take 1 tablet (10 mg total) by mouth daily. 04/09/18   Rosezella Rumpf, PA-C  amoxicillin-clavulanate (AUGMENTIN) 875-125 MG tablet Take 1 tablet by mouth 2 (two) times daily. One po bid x 7 days Patient not taking: Reported on 10/09/2023 04/09/18   Rosezella Rumpf, PA-C   benzonatate (TESSALON) 100 MG capsule Take 1 capsule (100 mg total) by mouth every 8 (eight) hours. Patient not taking: Reported on 10/09/2023 04/09/18   Rosezella Rumpf, PA-C  guaiFENesin (MUCINEX) 600 MG 12 hr tablet Take 1,200 mg by mouth 2 (two) times daily as needed for cough or to loosen phlegm. Patient not taking: Reported on 10/09/2023    [provider]  lisinopril (PRINIVIL,ZESTRIL) 10 MG tablet Take 1 tablet (10 mg total) by mouth daily. 04/09/18   Rosezella Rumpf, PA-C    Family History History reviewed. No pertinent family history.  Social History Social History   Tobacco Use   Smoking status: Never   Smokeless tobacco: Never  Vaping Use   Vaping status: Never Used  Substance Use Topics   Alcohol use: Yes   Drug use: No     Allergies   Patient has no known allergies.   Review of Systems Review of Systems  Skin:  Positive for rash.     Physical Exam Triage Vital Signs ED Triage Vitals  Encounter Vitals Group     BP 10/09/23 1310 122/79     Systolic BP Percentile --      Diastolic BP Percentile --      Pulse Rate 10/09/23 1310 78     Resp 10/09/23 1310 18     Temp 10/09/23 1310  99 F (37.2 C)     Temp Source 10/09/23 1310 Oral     SpO2 10/09/23 1310 98 %     Weight --      Height --      Head Circumference --      Peak Flow --      Pain Score 10/09/23 1308 0     Pain Loc --      Pain Education --      Exclude from Growth Chart --    No data found.  Updated Vital Signs BP 122/79 (BP Location: Left Arm) Comment (BP Location): large cuff/forearm  Pulse 78   Temp 99 F (37.2 C) (Oral)   Resp 18   LMP 04/03/2018   SpO2 98%   Visual Acuity Right Eye Distance:   Left Eye Distance:   Bilateral Distance:    Right Eye Near:   Left Eye Near:    Bilateral Near:     Physical Exam Constitutional:      General: She is not in acute distress.    Appearance: Normal appearance. She is not ill-appearing.  HENT:     Head: Normocephalic  and atraumatic.     Nose: Nose normal.     Mouth/Throat:     Mouth: Mucous membranes are moist.  Eyes:     Extraocular Movements: Extraocular movements intact.     Conjunctiva/sclera: Conjunctivae normal.     Pupils: Pupils are equal, round, and reactive to light.  Cardiovascular:     Rate and Rhythm: Normal rate and regular rhythm.  Pulmonary:     Effort: Pulmonary effort is normal.     Breath sounds: Normal breath sounds.  Musculoskeletal:        General: Normal range of motion.     Cervical back: Normal range of motion and neck supple.  Skin:    General: Skin is dry.     Findings: Erythema and rash present. Rash is macular, papular and urticarial.  Neurological:     General: No focal deficit present.     Mental Status: She is alert and oriented to person, place, and time.      UC Treatments / Results  Labs (all labs ordered are listed, but only abnormal results are displayed) Labs Reviewed - No data to display  EKG   Radiology No results found.  Procedures Procedures (including critical care time)  Medications Ordered in UC Medications - No data to display  Initial Impression / Assessment and Plan / UC Course  I have reviewed the triage vital signs and the nursing notes.  Pertinent labs & imaging results that were available during my care of the patient were reviewed by me and considered in my medical decision making (see chart for details).    Dermatitis likely related to ingestion of an unknown food which caused hypersensitivity to patient.  Treating with prednisone 20 mg once daily for 5 days, famotidine 20 mg twice daily for 7 days, and as needed Kenalog topical cream 3 times daily as needed.  Return precautions given if symptoms worsen or do not improve. Final Clinical Impressions(s) / UC Diagnoses   Final diagnoses:  Dermatitis     Discharge Instructions      Start medication today and take as directed.      ED Prescriptions     Medication  Sig Dispense Auth. Provider   predniSONE (DELTASONE) 20 MG tablet Take 1 tablet (20 mg total) by mouth daily with breakfast for 5 days. 5  tablet Bing Neighbors, NP   famotidine (PEPCID) 20 MG tablet Take 1 tablet (20 mg total) by mouth 2 (two) times daily for 7 days. 14 tablet Bing Neighbors, NP   triamcinolone ointment (KENALOG) 0.5 % Apply 1 Application topically 3 (three) times daily as needed (rash). 30 g Bing Neighbors, NP      PDMP not reviewed this encounter.   Bing Neighbors, NP 10/09/23 1415

## 2023-10-09 NOTE — Discharge Instructions (Addendum)
Start medication today and take as directed.

## 2023-10-09 NOTE — ED Triage Notes (Signed)
Patient states she has no allergies,  Saturday, patient went out to eat.  Started itching, started benadryl pills and used cream, but cream burned.  Rash is chest, neck and face-no history of this.  Patient denies any new hair products, skin products, makeup

## 2024-01-05 ENCOUNTER — Ambulatory Visit (HOSPITAL_COMMUNITY): Admission: EM | Admit: 2024-01-05 | Discharge: 2024-01-05 | Disposition: A | Discharge status: Home / Self Care

## 2024-01-05 ENCOUNTER — Encounter (HOSPITAL_COMMUNITY): Payer: Self-pay | Admitting: Emergency Medicine

## 2024-01-05 DIAGNOSIS — M545 Low back pain, unspecified: Secondary | ICD-10-CM | POA: Diagnosis not present

## 2024-01-05 MED ORDER — METHOCARBAMOL 500 MG PO TABS: 500.0000 mg | ORAL_TABLET | Freq: Two times a day (BID) | ORAL | 0 refills | Status: AC

## 2024-01-05 MED ORDER — CELECOXIB 100 MG PO CAPS: 100.0000 mg | ORAL_CAPSULE | Freq: Two times a day (BID) | ORAL | 0 refills | Status: DC

## 2024-01-05 MED ORDER — LIDOCAINE 4 % EX PTCH
MEDICATED_PATCH | CUTANEOUS | 0 refills | Status: AC
Start: 2024-01-05 — End: ?

## 2024-01-05 NOTE — ED Triage Notes (Signed)
 Patient c/o left sided lower back pain that radiates around to left hip x 2 weeks.  No apparent injury.  Does not radiate down leg.  Patient has taken Ibuprofen for pain.

## 2024-01-05 NOTE — ED Provider Notes (Signed)
 [facility]    CSN: [id number] Arrival date & time: 01/05/24  6213      History   Chief Complaint Chief Complaint  Patient presents with   Back Pain    HPI Joan Powers is a 48 y.o. female.   Patient presents today with a 2-week history of left lower back pain.  She reports that at rest pain is rated 8 but increases to 10 with prolonged ambulation or moving positions.  It is described as an intense pressure with periodic sharp pains.  She denies any known injury, recent trauma, fall.  She does have a strenuous job working in [employer] but is unsure if she did anything to injure herself.  She denies previous injury or surgery involving her back.  Denies personal history of malignancy.  She has no concern for pregnancy she is status post hysterectomy.  She has been taking ibuprofen without improvement of symptoms.  She denies any radiculopathy symptoms, bowel/bladder incontinence, lower extremity weakness, saddle anesthesia.  She called out of work today as result of symptoms and is requesting a work excuse note.    Past Medical History:  Diagnosis Date   Hypertension    Obesity     There are no active problems to display for this patient.   Past Surgical History:  Procedure Laterality Date   ABDOMINAL HYSTERECTOMY      OB History   No obstetric history on file.      Home Medications    Prior to Admission medications   Medication Sig Start Date End Date Taking? Authorizing Provider  amLODipine  (NORVASC ) 10 MG tablet Take 1 tablet (10 mg total) by mouth daily. 04/09/18  Yes Everlyn Hockey, PA-C  celecoxib  (CELEBREX ) 100 MG capsule Take 1 capsule (100 mg total) by mouth 2 (two) times daily. 01/05/24  Yes Cherylin Waguespack K, PA-C  lidocaine  (HM LIDOCAINE  PATCH) 4 % Apply 1 patch for 12 hours.  Remove this for minimum of 12 hours.  Use only 1 patch per 24 hours. 01/05/24  Yes Jerrard Bradburn K, PA-C  lisinopril  (PRINIVIL ,ZESTRIL ) 10 MG tablet Take 1 tablet (10 mg  total) by mouth daily. 04/09/18  Yes Rupert Counts F, PA-C  methocarbamol  (ROBAXIN ) 500 MG tablet Take 1 tablet (500 mg total) by mouth 2 (two) times daily. 01/05/24  Yes Jermeka Schlotterbeck K, PA-C  amoxicillin -clavulanate (AUGMENTIN ) 875-125 MG tablet Take 1 tablet by mouth 2 (two) times daily. One po bid x 7 days Patient not taking: Reported on 10/09/2023 04/09/18   Everlyn Hockey, PA-C  benzonatate  (TESSALON ) 100 MG capsule Take 1 capsule (100 mg total) by mouth every 8 (eight) hours. Patient not taking: Reported on 10/09/2023 04/09/18   Kehrli, Kelsey F, PA-C  chlorthalidone (HYGROTON) 25 MG tablet Take 25 mg by mouth daily.   Yes [provider]  famotidine  (PEPCID ) 20 MG tablet Take 1 tablet (20 mg total) by mouth 2 (two) times daily for 7 days. 10/09/23 10/16/23  Buena Carmine, NP  guaiFENesin (MUCINEX) 600 MG 12 hr tablet Take 1,200 mg by mouth 2 (two) times daily as needed for cough or to loosen phlegm. Patient not taking: Reported on 10/09/2023    [provider]  triamcinolone  ointment (KENALOG ) 0.5 % Apply 1 Application topically 3 (three) times daily as needed (rash). 10/09/23   Buena Carmine, NP    Family History History reviewed. No pertinent family history.  Social History Social History   Tobacco Use   Smoking status:  Never   Smokeless tobacco: Never  Vaping Use   Vaping status: Never Used  Substance Use Topics   Alcohol use: Yes   Drug use: No     Allergies   Patient has no known allergies.   Review of Systems Review of Systems  Constitutional:  Positive for activity change. Negative for appetite change, fatigue and fever.  Gastrointestinal:  Negative for abdominal pain, diarrhea, nausea and vomiting.  Musculoskeletal:  Positive for back pain. Negative for arthralgias and myalgias.  Neurological:  Negative for weakness and numbness.     Physical Exam Triage Vital Signs ED Triage Vitals  Encounter Vitals Group     BP 01/05/24 0849 120/83      Systolic BP Percentile --      Diastolic BP Percentile --      Pulse Rate 01/05/24 0849 84     Resp 01/05/24 0849 16     Temp 01/05/24 0849 98.3 F (36.8 C)     Temp Source 01/05/24 0849 Oral     SpO2 01/05/24 0849 98 %     Weight 01/05/24 0851 240 lb (108.9 kg)     Height 01/05/24 0851 5\' 1"  (1.549 m)     Head Circumference --      Peak Flow --      Pain Score 01/05/24 0851 8     Pain Loc --      Pain Education --      Exclude from Growth Chart --    No data found.  Updated Vital Signs BP 120/83 (BP Location: Left Arm)   Pulse 84   Temp 98.3 F (36.8 C) (Oral)   Resp 16   Ht 5\' 1"  (1.549 m)   Wt 240 lb (108.9 kg)   LMP 04/03/2018   SpO2 98%   BMI 45.35 kg/m   Visual Acuity Right Eye Distance:   Left Eye Distance:   Bilateral Distance:    Right Eye Near:   Left Eye Near:    Bilateral Near:     Physical Exam Vitals reviewed.  Constitutional:      General: She is awake. She is not in acute distress.    Appearance: Normal appearance. She is well-developed. She is not ill-appearing.     Comments: Very pleasant female appears stated age in no acute distress sitting comfortably in exam room  HENT:     Head: Normocephalic and atraumatic.  Cardiovascular:     Rate and Rhythm: Normal rate and regular rhythm.     Heart sounds: Normal heart sounds, S1 normal and S2 normal. No murmur heard. Pulmonary:     Effort: Pulmonary effort is normal.     Breath sounds: Normal breath sounds. No wheezing, rhonchi or rales.     Comments: Clear to auscultation bilaterally Abdominal:     Palpations: Abdomen is soft.     Tenderness: There is no abdominal tenderness.  Musculoskeletal:     Cervical back: No tenderness or bony tenderness.     Thoracic back: No tenderness or bony tenderness.     Lumbar back: Tenderness present. No spasms or bony tenderness. Positive left straight leg raise test. Negative right straight leg raise test.     Comments: Back: No pain percussion of  vertebrae.  No deformity or step-off noted.  Tenderness palpation of left lumbar paraspinal muscles.  Positive straight leg raise on left.  Strength 5/5 bilateral lower extremities.  Psychiatric:        Behavior: Behavior is cooperative.  UC Treatments / Results  Labs (all labs ordered are listed, but only abnormal results are displayed) Labs Reviewed - No data to display  EKG   Radiology No results found.  Procedures Procedures (including critical care time)  Medications Ordered in UC Medications - No data to display  Initial Impression / Assessment and Plan / UC Course  I have reviewed the triage vital signs and the nursing notes.  Pertinent labs & imaging results that were available during my care of the patient were reviewed by me and considered in my medical decision making (see chart for details).     Patient is well-appearing, afebrile, nontoxic, nontachycardic.  Patient denies any alarm symptoms that warrant emergent evaluation or imaging.  Plain films were deferred as she denies any recent trauma and has no focal bony tenderness.  Suspect muscular etiology.  She was started on Celebrex  twice daily with instruction to take additional NSAIDs with this medication due to risk of GI bleeding.  Can use acetaminophen /Tylenol  for breakthrough pain.  Was prescribed Robaxin  up to 2 times a day.  Discussed this can be sedating and she is not to drive or drink alcohol while taking it.  She was also given lidocaine  patches for symptom relief and we discussed that these should be placed for 12 hours during the day and then remove for 12 hours at night; use only 1 patch per 24 hours.  Recommended conservative treatment measures including heat, rest, stretch.  She is to follow-up with sports medicine if symptoms are not improving quickly and was given contact information for local provider with instruction to call to schedule an appointment.  Discussed that if she has any worsening  symptoms including severe pain, bowel/bladder incontinence, lower extremity weakness, saddle anesthesia she needs to be seen immediately.  Strict return precautions given.  Work excuse note provided.   Final Clinical Impressions(s) / UC Diagnoses   Final diagnoses:  Acute left-sided low back pain without sciatica     Discharge Instructions      I believe that you have injured the muscles in your back.  Please start Celebrex  twice a day.  Do not take other NSAIDs with this medication including aspirin, ibuprofen/Advil, naproxen/Aleve.  You can use acetaminophen /Tylenol  for breakthrough pain.  Take Robaxin  up to 2 times a day.  This will make you sleepy so do not drive or drink alcohol taking it.  Apply lidocaine  patch for 12 hours during the day; remove this for 12 hours at night.  Use only 1 patch per 24 hours.  I recommend you follow-up with sports medicine; call to schedule an appointment.  If anything worsens and you have severe pain, going to the bathroom on yourself without noticing it, numbness or tingling in your legs, weakness in your legs you need to be seen immediately.       ED Prescriptions     Medication Sig Dispense Auth. Provider   celecoxib  (CELEBREX ) 100 MG capsule Take 1 capsule (100 mg total) by mouth 2 (two) times daily. 60 capsule Paizlie Klaus K, PA-C   methocarbamol  (ROBAXIN ) 500 MG tablet Take 1 tablet (500 mg total) by mouth 2 (two) times daily. 20 tablet Lyrique Hakim K, PA-C   lidocaine  (HM LIDOCAINE  PATCH) 4 % Apply 1 patch for 12 hours.  Remove this for minimum of 12 hours.  Use only 1 patch per 24 hours. 10 patch Senon Nixon K, PA-C      PDMP not reviewed this encounter.   Erminie Foulks, [place]  K, PA-C 01/05/24 [id number]

## 2024-01-05 NOTE — Discharge Instructions (Signed)
 I believe that you have injured the muscles in your back.  Please start Celebrex  twice a day.  Do not take other NSAIDs with this medication including aspirin, ibuprofen/Advil, naproxen/Aleve.  You can use acetaminophen /Tylenol  for breakthrough pain.  Take Robaxin  up to 2 times a day.  This will make you sleepy so do not drive or drink alcohol taking it.  Apply lidocaine  patch for 12 hours during the day; remove this for 12 hours at night.  Use only 1 patch per 24 hours.  I recommend you follow-up with sports medicine; call to schedule an appointment.  If anything worsens and you have severe pain, going to the bathroom on yourself without noticing it, numbness or tingling in your legs, weakness in your legs you need to be seen immediately.

## 2024-05-20 ENCOUNTER — Telehealth (HOSPITAL_COMMUNITY): Payer: Self-pay

## 2024-05-20 ENCOUNTER — Ambulatory Visit (HOSPITAL_COMMUNITY)
Admission: EM | Admit: 2024-05-20 | Discharge: 2024-05-20 | Disposition: A | Discharge status: Home / Self Care | Attending: Family Medicine | Admitting: Family Medicine

## 2024-05-20 ENCOUNTER — Encounter (HOSPITAL_COMMUNITY): Payer: Self-pay

## 2024-05-20 DIAGNOSIS — K047 Periapical abscess without sinus: Secondary | ICD-10-CM

## 2024-05-20 DIAGNOSIS — I10 Essential (primary) hypertension: Secondary | ICD-10-CM | POA: Diagnosis not present

## 2024-05-20 MED ORDER — AMOXICILLIN-POT CLAVULANATE 875-125 MG PO TABS: 1.0000 | ORAL_TABLET | Freq: Two times a day (BID) | ORAL | 0 refills | Status: AC

## 2024-05-20 MED ORDER — KETOROLAC TROMETHAMINE 10 MG PO TABS: 10.0000 mg | ORAL_TABLET | Freq: Four times a day (QID) | ORAL | 0 refills | Status: DC | PRN

## 2024-05-20 MED ORDER — CHLORTHALIDONE 25 MG PO TABS: 25.0000 mg | ORAL_TABLET | Freq: Every day | ORAL | 0 refills | Status: AC

## 2024-05-20 MED ORDER — KETOROLAC TROMETHAMINE 30 MG/ML IJ SOLN
30.0000 mg | Freq: Once | INTRAMUSCULAR | Status: AC
  Administered 2024-05-20: 30 mg via INTRAMUSCULAR

## 2024-05-20 MED ORDER — AMOXICILLIN-POT CLAVULANATE 875-125 MG PO TABS: 1.0000 | ORAL_TABLET | Freq: Two times a day (BID) | ORAL | 0 refills | Status: DC

## 2024-05-20 MED ORDER — KETOROLAC TROMETHAMINE 30 MG/ML IJ SOLN
INTRAMUSCULAR | Status: AC
  Filled 2024-05-20: qty 1

## 2024-05-20 MED ORDER — CHLORTHALIDONE 25 MG PO TABS: 25.0000 mg | ORAL_TABLET | Freq: Every day | ORAL | 0 refills | Status: DC

## 2024-05-20 MED ORDER — KETOROLAC TROMETHAMINE 10 MG PO TABS: 10.0000 mg | ORAL_TABLET | Freq: Four times a day (QID) | ORAL | 0 refills | Status: AC | PRN

## 2024-05-20 NOTE — Discharge Instructions (Signed)
 You have been given a shot of Toradol  30 mg today.  Ketorolac  10 mg tablets--take 1 tablet every 6 hours as needed for pain.  This is the same medicine that is in the shot we just gave you  Take amoxicillin -clavulanate 875 mg--1 tab twice daily with food for 7 days  Chlorthalidone  25 mg--1 daily for your blood pressure.  Please make a follow-up appointment with your primary care to have further refills of your chronic maintenance medications.

## 2024-05-20 NOTE — ED Provider Notes (Signed)
 [facility]    CSN: [id number] Arrival date & time: 05/20/24  9163      History   Chief Complaint Chief Complaint  Patient presents with   Facial Swelling   Medication Refill    HPI Joan Powers is a 48 y.o. female.    Medication Refill Here for pain and swelling in her right lower jaw and cheek.  Began bothering her in the last 2 or 3 days.  No fever or chills.  She does have a little knot under her right jaw.  No cough or cold symptoms.  She does also have a history of hypertension and she is out of her chlorthalidone .  She does have a primary care and just needs to make an appointment for further refills.  NKDA  Last menstrual cycle was in 2019 when she had a hysterectomy.  Past Medical History:  Diagnosis Date   Hypertension    Obesity     There are no active problems to display for this patient.   Past Surgical History:  Procedure Laterality Date   ABDOMINAL HYSTERECTOMY      OB History   No obstetric history on file.      Home Medications    Prior to Admission medications   Medication Sig Start Date End Date Taking? Authorizing Provider  amoxicillin -clavulanate (AUGMENTIN ) 875-125 MG tablet Take 1 tablet by mouth 2 (two) times daily for 7 days. 05/20/24 05/27/24 Yes Lashunda Greis, Sharlet POUR, MD  ketorolac  (TORADOL ) 10 MG tablet Take 1 tablet (10 mg total) by mouth every 6 (six) hours as needed (pain). 05/20/24  Yes Vonna Sharlet POUR, MD  amLODipine  (NORVASC ) 10 MG tablet Take 1 tablet (10 mg total) by mouth daily. 04/09/18   Alva Larraine FALCON, PA-C  chlorthalidone  (HYGROTON ) 25 MG tablet Take 1 tablet (25 mg total) by mouth daily. 05/20/24   Vonna Sharlet POUR, MD  famotidine  (PEPCID ) 20 MG tablet Take 1 tablet (20 mg total) by mouth 2 (two) times daily for 7 days. Patient not taking: Reported on 05/20/2024 10/09/23 10/16/23  Arloa Suzen RAMAN, NP  guaiFENesin (MUCINEX) 600 MG 12 hr tablet Take 1,200 mg by mouth 2 (two) times daily as needed for cough or  to loosen phlegm. Patient not taking: Reported on 10/09/2023    [provider]  lidocaine  (HM LIDOCAINE  PATCH) 4 % Apply 1 patch for 12 hours.  Remove this for minimum of 12 hours.  Use only 1 patch per 24 hours. Patient not taking: Reported on 05/20/2024 01/05/24   Raspet, Erin K, PA-C  lisinopril  (PRINIVIL ,ZESTRIL ) 10 MG tablet Take 1 tablet (10 mg total) by mouth daily. 04/09/18   Alva Larraine FALCON, PA-C  methocarbamol  (ROBAXIN ) 500 MG tablet Take 1 tablet (500 mg total) by mouth 2 (two) times daily. Patient not taking: Reported on 05/20/2024 01/05/24   Raspet, Erin K, PA-C  triamcinolone  ointment (KENALOG ) 0.5 % Apply 1 Application topically 3 (three) times daily as needed (rash). Patient not taking: Reported on 05/20/2024 10/09/23   Arloa Suzen RAMAN, NP    Family History History reviewed. No pertinent family history.  Social History Social History   Tobacco Use   Smoking status: Never   Smokeless tobacco: Never  Vaping Use   Vaping status: Never Used  Substance Use Topics   Alcohol use: Yes   Drug use: No     Allergies   Patient has no known allergies.   Review of Systems Review of Systems   Physical Exam Triage Vital  Signs ED Triage Vitals  Encounter Vitals Group     BP 05/20/24 0851 (!) 152/101     Girls Systolic BP Percentile --      Girls Diastolic BP Percentile --      Boys Systolic BP Percentile --      Boys Diastolic BP Percentile --      Pulse Rate 05/20/24 0851 83     Resp 05/20/24 0851 16     Temp 05/20/24 0851 98.9 F (37.2 C)     Temp Source 05/20/24 0851 Oral     SpO2 05/20/24 0851 100 %     Weight --      Height --      Head Circumference --      Peak Flow --      Pain Score 05/20/24 0850 6     Pain Loc --      Pain Education --      Exclude from Growth Chart --    No data found.  Updated Vital Signs BP (!) 152/101 (BP Location: Right Arm) Comment: Patient has not had BP med today.  Pulse 83   Temp 98.9 F (37.2 C) (Oral)   Resp 16    LMP 04/03/2018   SpO2 100%   Visual Acuity Right Eye Distance:   Left Eye Distance:   Bilateral Distance:    Right Eye Near:   Left Eye Near:    Bilateral Near:     Physical Exam Vitals (Blood pressure here is 152/101) reviewed.  Constitutional:      General: She is not in acute distress.    Appearance: She is not ill-appearing, toxic-appearing or diaphoretic.  HENT:     Head:     Comments: There is swelling of the right external cheek.  It is not tender.    Right Ear: Tympanic membrane and ear canal normal.     Nose: Nose normal.     Mouth/Throat:     Mouth: Mucous membranes are moist.     Comments: There is some swelling around her right lower posterior teeth. Eyes:     Extraocular Movements: Extraocular movements intact.     Conjunctiva/sclera: Conjunctivae normal.     Pupils: Pupils are equal, round, and reactive to light.  Neck:     Comments: There is a right submandibular nodule consistent with a reactive lymph node about 1 cm in diameter Cardiovascular:     Rate and Rhythm: Normal rate and regular rhythm.     Heart sounds: No murmur heard. Pulmonary:     Effort: Pulmonary effort is normal.     Breath sounds: Normal breath sounds.  Musculoskeletal:     Cervical back: Neck supple.  Skin:    Coloration: Skin is not pale.  Neurological:     General: No focal deficit present.     Mental Status: She is alert and oriented to person, place, and time.  Psychiatric:        Behavior: Behavior normal.      UC Treatments / Results  Labs (all labs ordered are listed, but only abnormal results are displayed) Labs Reviewed - No data to display  EKG   Radiology No results found.  Procedures Procedures (including critical care time)  Medications Ordered in UC Medications  ketorolac  (TORADOL ) 30 MG/ML injection 30 mg (has no administration in time range)    Initial Impression / Assessment and Plan / UC Course  I have reviewed the triage vital signs and the  nursing  notes.  Pertinent labs & imaging results that were available during my care of the patient were reviewed by me and considered in my medical decision making (see chart for details).     She had been taking ibuprofen 800 mg without relief.  Toradol  injection is given here and Toradol  tablets are sent to the pharmacy for the pain.  Augmentin  is sent in for the dental infection  Her chlorthalidone  is sent in to treat her blood pressure which is currently not controlled.  She is established with primary care and will set up a follow-up appointment with them to have further refills Final Clinical Impressions(s) / UC Diagnoses   Final diagnoses:  Dental infection  Essential hypertension     Discharge Instructions      You have been given a shot of Toradol  30 mg today.  Ketorolac  10 mg tablets--take 1 tablet every 6 hours as needed for pain.  This is the same medicine that is in the shot we just gave you  Take amoxicillin -clavulanate 875 mg--1 tab twice daily with food for 7 days  Chlorthalidone  25 mg--1 daily for your blood pressure.  Please make a follow-up appointment with your primary care to have further refills of your chronic maintenance medications.     ED Prescriptions     Medication Sig Dispense Auth. Provider   amoxicillin -clavulanate (AUGMENTIN ) 875-125 MG tablet Take 1 tablet by mouth 2 (two) times daily for 7 days. 14 tablet Judia Arnott, Sharlet POUR, MD   ketorolac  (TORADOL ) 10 MG tablet Take 1 tablet (10 mg total) by mouth every 6 (six) hours as needed (pain). 20 tablet Vonna Sharlet POUR, MD   chlorthalidone  (HYGROTON ) 25 MG tablet Take 1 tablet (25 mg total) by mouth daily. 30 tablet Anjelique Makar K, MD      PDMP not reviewed this encounter.   Vonna Sharlet POUR, MD 05/20/24 1003

## 2024-05-20 NOTE — Telephone Encounter (Signed)
 Pharmacy is closed at [facility] on [address]. Will send to [facility] on [email or web address]

## 2024-05-20 NOTE — ED Triage Notes (Addendum)
 Patient has right upper and lower dental pain and facial swelling x 3 days.  Patient states she has been taking Ibuprofen for her symptoms.  Patient is also requesting a refill of [place]  25 mg to be refilled.
